# Patient Record
Sex: Male | Born: 1940 | Race: White | Hispanic: No | Marital: Married
Health system: Southern US, Community
[De-identification: ages and names within clinical notes are randomized; demographics above are authoritative.]

## PROBLEM LIST (undated history)

## (undated) DIAGNOSIS — E119 Type 2 diabetes mellitus without complications: Secondary | ICD-10-CM

## (undated) DIAGNOSIS — K219 Gastro-esophageal reflux disease without esophagitis: Secondary | ICD-10-CM

## (undated) DIAGNOSIS — F10959 Alcohol use, unspecified with alcohol-induced psychotic disorder, unspecified: Secondary | ICD-10-CM

## (undated) DIAGNOSIS — F419 Anxiety disorder, unspecified: Secondary | ICD-10-CM

## (undated) DIAGNOSIS — G4733 Obstructive sleep apnea (adult) (pediatric): Secondary | ICD-10-CM

## (undated) DIAGNOSIS — K222 Esophageal obstruction: Secondary | ICD-10-CM

## (undated) DIAGNOSIS — I219 Acute myocardial infarction, unspecified: Secondary | ICD-10-CM

## (undated) DIAGNOSIS — I2510 Atherosclerotic heart disease of native coronary artery without angina pectoris: Secondary | ICD-10-CM

## (undated) DIAGNOSIS — G8929 Other chronic pain: Secondary | ICD-10-CM

## (undated) DIAGNOSIS — Z8679 Personal history of other diseases of the circulatory system: Secondary | ICD-10-CM

## (undated) DIAGNOSIS — F10221 Alcohol dependence with intoxication delirium: Secondary | ICD-10-CM

## (undated) DIAGNOSIS — I10 Essential (primary) hypertension: Secondary | ICD-10-CM

## (undated) HISTORY — DX: Alcohol use, unspecified with alcohol-induced psychotic disorder, unspecified: F10.959

## (undated) HISTORY — DX: Esophageal obstruction: K22.2

## (undated) HISTORY — DX: Other chronic pain: G89.29

## (undated) HISTORY — DX: Anxiety disorder, unspecified: F41.9

## (undated) HISTORY — DX: Gastro-esophageal reflux disease without esophagitis: K21.9

## (undated) HISTORY — DX: Personal history of other diseases of the circulatory system: Z86.79

## (undated) HISTORY — DX: Alcohol dependence with intoxication delirium: F10.221

## (undated) HISTORY — DX: Obstructive sleep apnea (adult) (pediatric): G47.33

## (undated) HISTORY — DX: Acute myocardial infarction, unspecified: I21.9

## (undated) HISTORY — DX: Type 2 diabetes mellitus without complications: E11.9

## (undated) HISTORY — DX: Atherosclerotic heart disease of native coronary artery without angina pectoris: I25.10

---

## 2005-06-01 ENCOUNTER — Inpatient Hospital Stay (HOSPITAL_COMMUNITY): Admission: AD | Admit: 2005-06-01 | Discharge: 2005-06-03 | Payer: Self-pay | Admitting: Cardiology

## 2005-06-01 ENCOUNTER — Ambulatory Visit: Payer: Self-pay | Admitting: Cardiology

## 2005-06-16 ENCOUNTER — Ambulatory Visit: Payer: Self-pay | Admitting: Cardiology

## 2005-06-19 ENCOUNTER — Ambulatory Visit: Payer: Self-pay | Admitting: Internal Medicine

## 2005-07-27 ENCOUNTER — Ambulatory Visit: Payer: Self-pay | Admitting: Cardiology

## 2005-07-31 ENCOUNTER — Emergency Department (HOSPITAL_COMMUNITY): Admission: EM | Admit: 2005-07-31 | Discharge: 2005-07-31 | Payer: Self-pay | Admitting: *Deleted

## 2005-08-24 ENCOUNTER — Ambulatory Visit: Payer: Self-pay | Admitting: Internal Medicine

## 2009-01-21 ENCOUNTER — Emergency Department (HOSPITAL_COMMUNITY): Admission: EM | Admit: 2009-01-21 | Discharge: 2009-01-21 | Payer: Self-pay | Admitting: Emergency Medicine

## 2009-05-28 ENCOUNTER — Emergency Department (HOSPITAL_COMMUNITY): Admission: EM | Admit: 2009-05-28 | Discharge: 2009-05-28 | Payer: Self-pay | Admitting: Emergency Medicine

## 2010-09-10 ENCOUNTER — Encounter: Payer: Self-pay | Admitting: Emergency Medicine

## 2010-11-24 LAB — BASIC METABOLIC PANEL
BUN: 14 mg/dL (ref 6–23)
CO2: 27 mEq/L (ref 19–32)
Calcium: 9.8 mg/dL (ref 8.4–10.5)
Chloride: 102 mEq/L (ref 96–112)
Creatinine, Ser: 0.83 mg/dL (ref 0.4–1.5)
GFR calc Af Amer: >60 mL/min (ref >60)
GFR calc non Af Amer: >60 mL/min (ref >60)
Glucose, Bld: 108 mg/dL — ABNORMAL HIGH (ref 70–99)
Potassium: 3.9 mEq/L (ref 3.5–5.1)
Sodium: 140 mEq/L (ref 135–145)

## 2010-11-24 LAB — CBC
HCT: 40.2 % (ref 39.0–52.0)
Hemoglobin: 13.8 g/dL (ref 13.0–17.0)
MCHC: 34.3 g/dL (ref 30.0–36.0)
MCV: 97 fL (ref 78.0–100.0)
Platelets: 118 10*3/uL — ABNORMAL LOW (ref 150–400)
RBC: 4.15 MIL/uL — ABNORMAL LOW (ref 4.22–5.81)
RDW: 13.9 % (ref 11.5–15.5)
WBC: 6.4 10*3/uL (ref 4.0–10.5)

## 2010-11-24 LAB — POCT CARDIAC MARKERS
CKMB, poc: 2.1 ng/mL (ref 1.0–8.0)
Myoglobin, poc: 90.4 ng/mL (ref 12–200)
Troponin i, poc: <0.05 ng/mL (ref 0.00–0.09)

## 2011-01-06 NOTE — H&P (Signed)
NAMEBARTH, TRELLA NO.:  [id number]   MEDICAL RECORD NO.:  [id number]           PATIENT TYPE:   LOCATION:                                 FACILITY:   PHYSICIAN:  Salvadore Farber, M.D. [facility] DATE OF BIRTH:   DATE OF ADMISSION:  06/01/2005  DATE OF DISCHARGE:                                HISTORY & PHYSICAL   CHIEF COMPLAINT:  Acute inferior myocardial infarction.   HISTORY OF PRESENT ILLNESS:  Mr. Blancett is a 70 year old gentleman with no  prior history of cardiovascular disease.  His atherosclerotic risk factors  include age, hypertension, sex, and unknown lipid status.   Mr. Garron developed substernal chest discomfort between 10-11 p.m. last  night.  He took a muscle relaxant for this with some improvement, but not  complete resolution of his discomfort.  He slept for a bit and then awoke  with persistent pain.  He eventually presented to the [facility] Emergency  Room at 4:45 a.m.  He denies any shortness of breath, nausea, syncope, or  palpitations.  The pain radiated to his neck and left arm.  Electrocardiogram at [facility] demonstrated inferior myocardial  infarction.  He was then transferred emergently for cardiac catheterization  and possible percutaneous coronary revascularization.  He arrived here with  ongoing 4/10 substernal chest discomfort radiating to the left arm.  He was  hemodynamically stable upon arrival.   PAST MEDICAL HISTORY:  1.  Hypertension.  2.  Medical noncompliance.  3.  Distant left shoulder surgery.   ALLERGIES:  BENADRYL.   MEDICATIONS:  An antihypertensive that he cannot recall.   SOCIAL HISTORY:  Patient is married, but does not live with his wife.  He  lives in an RV and travels.  Wife states that this living situation is due  to his ongoing alcohol abuse.  He lists his mailing address in a post office  box in [place].  He has a son who lives in [place].  He admits to alcohol  abuse, but will not quantify.  Wife  thinks it is very heavy.  Patient says  he has not drank in two weeks, wife disagrees.  Denies tobacco abuse.   FAMILY HISTORY:  Mother died in her 19s of heart disease.  Father died in  his 5s of cause unknown to the patient.  His only sister is alive and well.   REVIEW OF SYSTEMS:  Negative in detail except as above.   PHYSICAL EXAMINATION:  GENERAL:  He is a generally well-appearing,  overweight gentleman in no distress with heart rate 64, blood pressure  149/72.  HEENT:  Remarkable for dentures on the upper teeth, otherwise normal.  SKIN:  Normal.  MUSCULOSKELETAL:  Normal.  NECK:  He has no jugular venous distention and no thyromegaly.  LUNGS:  Respiratory effort is normal.  Lungs are clear to auscultation.  HEART:  He has a nondisplaced point of maximal cardiac impulse.  There is a  regular rate and rhythm without murmurs, rubs, or gallops.  ABDOMEN:  Soft, nondistended, nontender.  There is no hepatosplenomegaly.  Bowel sounds are normal.  There is no mass.  EXTREMITIES:  Warm without clubbing, cyanosis, edema, or ulceration.  Carotid pulses are 2+ bilaterally without bruits.  Femoral pulses 2+  bilaterally without bruits.  PT pulses 2+ bilaterally.  DP pulse 2+ on the  right and trace on the left.  He is alert and oriented x3 with normal  affect.   Electrocardiogram:  Normal sinus rhythm with approximately 3 mm of inferior  ST elevations and anterolateral depressions.   IMPRESSION/RECOMMENDATION:  1.  Chest pain:  Acute inferior myocardial infarction.  Proceeding to urgent      catheterization and probable percutaneous coronary intervention.  2.  Hypertension:  Will add beta blocker and likely ACE inhibitor.  3.  Questionable lipid status.  Begin high-dose Statin and check fasting      lipid profile.      Salvadore Farber, M.D. [facility]  Electronically Signed     WED/MEDQ  D:  06/01/2005  T:  06/01/2005  Job:  [id number]

## 2011-01-06 NOTE — Cardiovascular Report (Signed)
NAMEGIOVANY, COSBY NO.:  [id number]   MEDICAL RECORD NO.:  [id number]          PATIENT TYPE:  EMS   LOCATION:  ED                           FACILITY:  [facility]   PHYSICIAN:  Salvadore Farber, M.D. LHCDATE OF BIRTH:  09/08/1940   DATE OF PROCEDURE:  06/01/2005  DATE OF DISCHARGE:                              CARDIAC CATHETERIZATION   PROCEDURE:  Left heart catheterization, left ventriculography, coronary  angiography, bare metal stenting of the distal right coronary artery.   INDICATIONS:  Mr. Vences is a 70 year old gentleman with hypertension and  alcohol abuse.  He has no prior history of cardiac disease.  He presents  with acute inferior myocardial infarction.  Pain onset was at 11 p.m.  He  arrived to the [facility] emergency room at approximately 4:45 a.m.  An  electrocardiogram demonstrated inferior ST elevations.  He had ongoing chest  discomfort.  He was treated with aspirin and heparin and transferred  emergently to the catheterization lab at [facility].  He arrived  here at 5:31 with ongoing 3/10 chest discomfort.  We then proceeded to  diagnostic angiography with an eye to percutaneous revascularization.   PROCEDURAL TECHNIQUE:  Informed consent was obtained.  Under 1% local  anesthesia, a 6 French sheath was placed in the right common femoral artery  using modified Seldinger technique.  Diagnostic angiography was performed  using a diagnostic JL4 catheter for the left system and a JR4 guiding  catheter for the right.  These images demonstrated multivessel disease with  the culprit lesion being a heavily thrombotic 99% stenosis of the distal RCA  just before the takeoff of the PDA.  There was TIMI-2 flow to the distal  vasculature.  We then proceeded to urgent revascularization.   Additional heparin was given to achieve and maintain an ACT of greater than  200 seconds.  Double bolus eptifibatide was administered.  The patient had  consented  to participate in the CHAMPION trial.  He was randomized and then  treated with study drug per protocol.   A ProWater wire was advanced beyond the lesion into the PDA without  difficulty.  I then attempted to pass a Diver thrombectomy catheter.  However, I was unable to pass this beyond the midlesion due to very poor  guide support and tortuosity of the vessel.  I therefore proceeded to  predilation with a 3.0 x 12 mm Maverick at 6 atmospheres.  This improved  flow from TIMI-2 to TIMI-3.  It was difficult to pass this balloon.  It was  clear that I would be unable to pass a stent with the current guide support.  Therefore, I removed guide and wire and replaced the guide with an ART-4  guide.  This was engaged in the ostium of the RCA.  I then advanced a  support wire across the lesion into the PDA.  I then advanced a ProWater  wire also into the PDA to be used in a buddy fashion.  I then advanced a 3.5  x 15 mm Driver stent over the support wire.  I removed the ProWater wire  once the stent was in position.  I then deployed the stent at 16  atmospheres, taking care to position the distal margin of the stent just  proximal to the bifurcation of the RCA.  Repeat angiography demonstrated no  residual stenosis, no embolism, and TIMI-3 flow to the distal vasculature.   The guiding catheter was then removed.  A pigtail catheter was then advanced  over a wire and positioned in the left ventricle.  Left heart  catheterization and ventriculography were performed.  The catheter was then  removed over a wire and the patient was transferred to the holding room in  stable condition.  He tolerated the procedure well.   COMPLICATIONS:  None.   Pain onset 11 p.m., [facility] emergency room arrival [phone],  catheterization lab arrival [phone], reperfusion 0559.   FINDINGS:  1.  LV:  147/9/25.  EF 60% with inferior akinesis.  2.  No aortic stenosis or mitral regurgitation.  3.  Left main:   Angiographically normal.  4.  LAD:  Moderate-sized vessel giving rise to a single large diagonal.  The      proximal LAD has a 70% stenosis crossing the origin of this diagonal.      The diagonal has an 80% ostial stenosis.  5.  Ramus intermedius:  Moderate-sized vessel which is angiographically      normal.  6.  Circumflex:  Moderate-sized vessel giving rise to a single large      marginal.  There is a 90% stenosis proximally and a 60% stenosis in the      midvessel.  7.  RCA:  Large, dominant vessel.  It is tortuous proximally.  There is a      40% stenosis through the proximal vessel.  There was then a heavily      thrombotic 99% stenosis in the vessel just before the takeoff of the      PDA.  A large PLV has a 70% stenosis proximally.  The thrombotic lesion      was stented to no residual.   IMPRESSION/RECOMMENDATIONS:  Successful percutaneous revascularization of  the culprit lesion in the right coronary artery.  He does have residual  severe disease in the left system, including a bifurcation left anterior  descending artery lesion.  He has a history of very poor compliance with  medicine, medical therapy.  Recommend coronary artery bypass grafting.      Salvadore Farber, M.D. [facility]  Electronically Signed     WED/MEDQ  D:  06/01/2005  T:  06/01/2005  Job:  [id number]

## 2011-01-06 NOTE — Discharge Summary (Signed)
NAMEWINDLE, HUEBERT                 ACCOUNT NO.:  [id number]   MEDICAL RECORD NO.:  [id number]          PATIENT TYPE:  INP   LOCATION:  2906                         FACILITY:  [facility]   PHYSICIAN:  Salvadore Farber, M.D. LHCDATE OF BIRTH:  1941/07/28   DATE OF ADMISSION:  06/01/2005  DATE OF DISCHARGE:  06/03/2005                                 DISCHARGE SUMMARY   PRINCIPAL DIAGNOSIS:  Acute inferior myocardial infarction/coronary artery  disease.   OTHER DIAGNOSES:  1.  Hypertension.  2.  Medical non-adherence.  3.  Distant left shoulder surgery.   ALLERGIES:  Benadryl.   PROCEDURES:  Left heart cardiac catheterization with PCI and stenting of the  right coronary artery with a 3.5 by 15 mm Driver bare metal stent.   HISTORY OF PRESENT ILLNESS:  70 year old male with no prior history of CAD  who developed substernal chest pain at approximately 10 to 11 p.m. on  June 01, 2005, prompting him to take a muscle relaxant with some  improvement but not complete resolution of the discomfort.  He slept a bit  and then awoke with persistent pain at approximately 4:45 a.m. without  associated symptoms with radiation to the neck and left arm.  He presented  to [facility] where an EKG was suggestive of inferior myocardial  infarction.  He was then transferred emergently for cardiac catheterization  to [facility].   HOSPITAL COURSE:  The patient underwent left heart cardiac catheterization  performed by Dr. Samule Ohm revealing normal LV function with an EF of 60% and  inferior akinesis.  He was noted to have a 70% stenosis in the proximal LAD  with a normal ramus, a 90% lesion in the mid left circumflex, and a 99%  thrombotic stenosis in the distal RCA.  The RCA lesion was felt to be the  culprit lesion and this was successfully angioplastied and stented with a 3  by 50 mm Driver stent.  Following this procedure, he was monitored in the  CCU and was initiated on aspirin,  beta blocker, statin therapy.  Plavix was  not immediately used secondary to his need for CVTS consult given his  residual LAD and circumflex disease.  Mr. Dutter refused consideration for  bypass surgery and, therefore, Plavix was initiated on June 02, 2005.  He  continued to have intermittent and vague chest discomfort while  hospitalized, however, on October 14, was adamant about leaving against  medical advice.  Prior to his discharge, we did counsel him on the  importance of alcohol cessation and also provided him with the number for  Dr. Tyrone Sage in cardiovascular and thoracic surgery should he have a change  of heart.   DISCHARGE LABORATORY DATA:  Hemoglobin 12.5, hematocrit 36.6, WBC 7.3,  platelets 242, sodium, 139, potassium 3.9, chloride 109, CO2 25, BUN 11,  creatinine 0.9, glucose 102, total bilirubin 0.5, alkaline phos 52, AST 22,  ALT 18, total protein 5.7, albumin 3.1, calcium 8.9.  Hemoglobin A1C 5.9.  Peak CK 205, peak MB 25.4, peak troponin I 2.61.  Total cholesterol 171,  triglycerides 94, HDL 44, LDL 108, TSH 1.224.   DISPOSITION:  The patient is leaving against medical advice today.   DISCHARGE INSTRUCTIONS:  The patient was advised to contact Dr. Melinda Crutch  office for a follow up appointment in approximately two weeks.  He is also  provided for the number for Dr. Sheliah Plane for cardiac surgical follow  up should the patient reconsider.   DISCHARGE MEDICATIONS:  Atorvastatin 80 mg q.h.s., aspirin 325 mg daily,  Metoprolol 25 mg t.i.d., multi-vitamin one daily, Soma 350 mg 3 tabs daily,  potassium and magnesium 250 mg daily, Plavix 75 mg daily.   Outstanding lab studies none.  Duration of discharge encounter 20 minutes.      Ok Anis, NP      Salvadore Farber, M.D. [facility]  Electronically Signed    CRB/MEDQ  D:  08/28/2005  T:  08/28/2005  Job:  [id number]

## 2012-11-19 ENCOUNTER — Emergency Department (HOSPITAL_COMMUNITY): Payer: Medicare Other

## 2012-11-19 ENCOUNTER — Encounter (HOSPITAL_COMMUNITY): Payer: Self-pay | Admitting: Emergency Medicine

## 2012-11-19 ENCOUNTER — Emergency Department (HOSPITAL_COMMUNITY)
Admission: EM | Admit: 2012-11-19 | Discharge: 2012-11-19 | Disposition: A | Discharge status: Home / Self Care | Payer: Medicare Other | Attending: Emergency Medicine | Admitting: Emergency Medicine

## 2012-11-19 DIAGNOSIS — IMO0002 Reserved for concepts with insufficient information to code with codable children: Secondary | ICD-10-CM | POA: Insufficient documentation

## 2012-11-19 DIAGNOSIS — F10929 Alcohol use, unspecified with intoxication, unspecified: Secondary | ICD-10-CM

## 2012-11-19 DIAGNOSIS — I10 Essential (primary) hypertension: Secondary | ICD-10-CM | POA: Insufficient documentation

## 2012-11-19 DIAGNOSIS — R4789 Other speech disturbances: Secondary | ICD-10-CM | POA: Insufficient documentation

## 2012-11-19 DIAGNOSIS — F411 Generalized anxiety disorder: Secondary | ICD-10-CM | POA: Insufficient documentation

## 2012-11-19 DIAGNOSIS — F1010 Alcohol abuse, uncomplicated: Secondary | ICD-10-CM | POA: Insufficient documentation

## 2012-11-19 DIAGNOSIS — I959 Hypotension, unspecified: Secondary | ICD-10-CM

## 2012-11-19 HISTORY — DX: Essential (primary) hypertension: I10

## 2012-11-19 LAB — CBC WITH DIFFERENTIAL/PLATELET
Basophils Absolute: 0 10*3/uL (ref 0.0–0.1)
Lymphocytes Relative: 49 % — ABNORMAL HIGH (ref 12–46)
Monocytes Relative: 12 % (ref 3–12)
Neutrophils Relative %: 37 % — ABNORMAL LOW (ref 43–77)
Platelets: 215 10*3/uL (ref 150–400)
RDW: 13.1 % (ref 11.5–15.5)
WBC: 9.3 10*3/uL (ref 4.0–10.5)

## 2012-11-19 LAB — COMPREHENSIVE METABOLIC PANEL
ALT: 39 U/L (ref 0–53)
AST: 47 U/L — ABNORMAL HIGH (ref 0–37)
Calcium: 8.8 mg/dL (ref 8.4–10.5)
Creatinine, Ser: 1.49 mg/dL — ABNORMAL HIGH (ref 0.50–1.35)
GFR calc Af Amer: 53 mL/min — ABNORMAL LOW (ref >90)
Sodium: 140 mEq/L (ref 135–145)
Total Protein: 6.7 g/dL (ref 6.0–8.3)

## 2012-11-19 LAB — PROTIME-INR
INR: 1.01 (ref 0.00–1.49)
Prothrombin Time: 13.2 seconds (ref 11.6–15.2)

## 2012-11-19 LAB — OCCULT BLOOD, POC DEVICE: Fecal Occult Bld: NEGATIVE

## 2012-11-19 LAB — URINALYSIS, ROUTINE W REFLEX MICROSCOPIC
Bilirubin Urine: NEGATIVE
Nitrite: NEGATIVE
Protein, ur: NEGATIVE mg/dL
Specific Gravity, Urine: 1.034 — ABNORMAL HIGH (ref 1.005–1.030)
Urobilinogen, UA: 0.2 mg/dL (ref 0.0–1.0)

## 2012-11-19 LAB — TROPONIN I: Troponin I: <0.3 ng/mL (ref <0.30)

## 2012-11-19 MED ORDER — THIAMINE HCL 100 MG/ML IJ SOLN
Freq: Once | INTRAVENOUS | Status: AC
  Administered 2012-11-19: 16:00:00 via INTRAVENOUS
  Filled 2012-11-19: qty 1000

## 2012-11-19 MED ORDER — SODIUM CHLORIDE 0.9 % IV BOLUS (SEPSIS)
500.0000 mL | Freq: Once | INTRAVENOUS | Status: AC
  Administered 2012-11-19: 500 mL via INTRAVENOUS

## 2012-11-19 NOTE — ED Notes (Signed)
Patient transported to CT 

## 2012-11-19 NOTE — ED Provider Notes (Signed)
Pt BP improved with IVF. Pt is now awake. States he drinks daily and was drinking heavily at the bar. He denies chest pain or shortness of breath. Requesting food. Will repeat CXR with better positioning. I believe disection is very unlikely.    Repeat CXR with no evidence of dissection. Pt tolerating PO's. Ambulated without difficulty. No slurred speech. Not clinically intoxicated. Will d/c home.   Loren Racer, MD 11/19/12 2201

## 2012-11-19 NOTE — Progress Notes (Signed)
[facility] ED CM noted pt with coverage but no pcp listed Spoke with pt who would not open his eyes during CM assessment.  Spoke with Cm with eyes closed and confirms no pcp but aware of how to obtain one with [employer] insurance coverage He would not answer if he is followed by [facility], [facility] or the [facility]

## 2012-11-19 NOTE — ED Provider Notes (Signed)
History     CSN: [id number]  Arrival date & time 11/19/12  1410   First MD Initiated Contact with Patient 11/19/12 1441      Chief Complaint  Patient presents with  . Alcohol Intoxication    (Consider location/radiation/quality/duration/timing/severity/associated sxs/prior treatment) HPI Comments: Patient brought to the ER from the bar. Patient was found sitting at a table, slumped over and was difficult to awaken. Patient brought to the emergency department. He has been hypotensive during transport. On arrival, patient is cursing and agitated, does not answer questions. He does follow commands. No further information available. Level 5 caveat applies due to patient's mental status.  Patient is a 72 y.o. male presenting with intoxication.  Alcohol Intoxication    No past medical history on file.  No past surgical history on file.  No family history on file.  History  Substance Use Topics  . Smoking status: Not on file  . Smokeless tobacco: Not on file  . Alcohol Use: Not on file      Review of Systems  Unable to perform ROS   Allergies  Review of patient's allergies indicates not on file.  Home Medications  No current outpatient prescriptions on file.  BP 91/53  Pulse 59  Temp(Src) 97.4 F (36.3 C) (Axillary)  Resp 18  SpO2 93%  Physical Exam  Constitutional: He appears well-developed and well-nourished. He appears distressed.  HENT:  Head: Normocephalic and atraumatic.  Right Ear: Hearing normal.  Nose: Nose normal.  Mouth/Throat: Oropharynx is clear and moist and mucous membranes are normal.  Eyes: Conjunctivae and EOM are normal. Pupils are equal, round, and reactive to light.  Neck: Normal range of motion. Neck supple.  Cardiovascular: Normal rate, regular rhythm, S1 normal and S2 normal.  Exam reveals no gallop and no friction rub.   No murmur heard. Pulmonary/Chest: Effort normal and breath sounds normal. No respiratory distress. He exhibits no  tenderness.  Abdominal: Soft. Normal appearance and bowel sounds are normal. There is no hepatosplenomegaly. There is no tenderness. There is no rebound, no guarding, no tenderness at McBurney's point and negative Murphy's sign. No hernia.  Musculoskeletal: Normal range of motion.  Neurological: He is alert. He has normal strength. No cranial nerve deficit or sensory deficit. Coordination normal. GCS eye subscore is 4. GCS verbal subscore is 5. GCS motor subscore is 6.  Skin: Skin is warm, dry and intact. No rash noted. No cyanosis.  Psychiatric: His mood appears anxious. His speech is slurred. He is agitated.    ED Course  Procedures (including critical care time)  EKG  Date: 11/19/2012  Rate: 62  Rhythm: normal sinus rhythm  QRS Axis: normal  Intervals: normal  ST/T Wave abnormalities: nonspecific ST/T changes  Conduction Disutrbances:right bundle branch block  Narrative Interpretation:   Old EKG Reviewed: unchanged    Labs Reviewed  PROTIME-INR  CBC WITH DIFFERENTIAL  COMPREHENSIVE METABOLIC PANEL  PRO B NATRIURETIC PEPTIDE  TROPONIN I  URINALYSIS, ROUTINE W REFLEX MICROSCOPIC  ETHANOL  OCCULT BLOOD, POC DEVICE   Dg Chest Port 1 View  11/19/2012  *RADIOLOGY REPORT*  Clinical Data: Hypotension.  Mental status changes.  PORTABLE CHEST - 1 VIEW  Comparison: 05/28/2009.  Findings: Cardiomegaly.  Pulmonary vascular congestion.  No gross pneumothorax or segmental consolidation.  Prominent aortic knob.  This is more notable than on the prior examination which may reflect changes of progressive atherosclerosis and / or magnification however, if there were any clinical suspicion of aortic abnormality contributing to  the patient's hypertension, CT may then be considered.  IMPRESSION: Prominent aortic knob.  This is more notable than on the prior examination which may reflect changes of progressive atherosclerosis and / or magnification however, if there were any clinical suspicion of  aortic abnormality contributing to the patient's hypertension, CT may then be considered.  Cardiomegaly.  Pulmonary vascular congestion.  Critical Value/emergent results were called by telephone at the time of interpretation on 11/20/2038 at 3:30 p.m. to Dr. Blinda Leatherwood, who verbally acknowledged these results.   Original Report Authenticated By: Lacy Duverney, M.D.      Diagnosis: Acute alcohol intoxication; hypertension    MDM  Patient presents from a bar. He is acutely intoxicated. Patient was found to be very hypertensive as well. At this time, source of hypotension is unclear. He does not appear to be having any ongoing fluid losses. No evidence of bleeding. Rectal was negative. Patient's blood pressure is slowly coming up with IV fluids. Patient's lactate was moderately elevated at arrival. Cannot rule out sepsis at this time. Did not have an obvious source, however.  Patient's chest x-ray did not show any evidence of pneumonia. He was, however, concern for widening of the mediastinum. Awaiting BUN/creatinine to see if the patient can begin IV dye. As his pressure is improving I believe there is time to wait for CT angiography.  Remainder of the labs and workup not back at this time. Will sign out to Dr. Ranae Palms to follow and disposition.      Gilda Crease, MD 11/19/12 [phone]

## 2012-11-19 NOTE — ED Notes (Signed)
Bed:WA05<BR> Expected date:<BR> Expected time:<BR> Means of arrival:<BR> Comments:<BR>

## 2012-11-19 NOTE — ED Notes (Signed)
Per [employer]: Pt picked up from [employer].  Pt was sitting at a table and dozing off.  Pt was not responding to questions appropriately.

## 2012-11-19 NOTE — ED Notes (Signed)
Patient transported to X-ray 

## 2013-10-19 HISTORY — PX: CORONARY ARTERY BYPASS GRAFT: SHX141

## 2013-11-07 ENCOUNTER — Other Ambulatory Visit: Payer: Self-pay | Admitting: *Deleted

## 2013-11-07 MED ORDER — CHLORDIAZEPOXIDE HCL 10 MG PO CAPS: ORAL_CAPSULE | ORAL | Status: DC

## 2013-11-07 MED ORDER — CHLORDIAZEPOXIDE HCL 10 MG PO CAPS: ORAL_CAPSULE | ORAL | Status: AC

## 2013-11-07 MED ORDER — LORAZEPAM 2 MG PO TABS: ORAL_TABLET | ORAL | Status: AC

## 2013-11-07 MED ORDER — LORAZEPAM 2 MG PO TABS: ORAL_TABLET | ORAL | Status: DC

## 2013-11-07 NOTE — Telephone Encounter (Signed)
[facility] [place] 

## 2013-11-07 NOTE — Telephone Encounter (Signed)
[facility] [place] 

## 2013-11-11 ENCOUNTER — Non-Acute Institutional Stay (SKILLED_NURSING_FACILITY): Payer: Medicare Other | Admitting: Internal Medicine

## 2013-11-11 DIAGNOSIS — G4733 Obstructive sleep apnea (adult) (pediatric): Secondary | ICD-10-CM

## 2013-11-11 DIAGNOSIS — F10931 Alcohol use, unspecified with withdrawal delirium: Secondary | ICD-10-CM

## 2013-11-11 DIAGNOSIS — K222 Esophageal obstruction: Secondary | ICD-10-CM

## 2013-11-11 DIAGNOSIS — F10231 Alcohol dependence with withdrawal delirium: Secondary | ICD-10-CM

## 2013-11-11 DIAGNOSIS — F411 Generalized anxiety disorder: Secondary | ICD-10-CM

## 2013-11-11 DIAGNOSIS — Z951 Presence of aortocoronary bypass graft: Secondary | ICD-10-CM

## 2013-11-11 DIAGNOSIS — G8929 Other chronic pain: Secondary | ICD-10-CM

## 2013-11-11 DIAGNOSIS — F10959 Alcohol use, unspecified with alcohol-induced psychotic disorder, unspecified: Secondary | ICD-10-CM

## 2013-11-11 DIAGNOSIS — F419 Anxiety disorder, unspecified: Secondary | ICD-10-CM

## 2013-11-11 DIAGNOSIS — F10221 Alcohol dependence with intoxication delirium: Secondary | ICD-10-CM

## 2013-11-11 DIAGNOSIS — F10988 Alcohol use, unspecified with other alcohol-induced disorder: Secondary | ICD-10-CM

## 2013-11-11 DIAGNOSIS — E119 Type 2 diabetes mellitus without complications: Secondary | ICD-10-CM

## 2013-11-11 DIAGNOSIS — I4891 Unspecified atrial fibrillation: Secondary | ICD-10-CM

## 2013-11-11 DIAGNOSIS — I2510 Atherosclerotic heart disease of native coronary artery without angina pectoris: Secondary | ICD-10-CM

## 2013-11-16 ENCOUNTER — Encounter: Payer: Self-pay | Admitting: Internal Medicine

## 2013-11-16 DIAGNOSIS — K219 Gastro-esophageal reflux disease without esophagitis: Secondary | ICD-10-CM | POA: Insufficient documentation

## 2013-11-16 DIAGNOSIS — Z8679 Personal history of other diseases of the circulatory system: Secondary | ICD-10-CM | POA: Insufficient documentation

## 2013-11-16 DIAGNOSIS — K222 Esophageal obstruction: Secondary | ICD-10-CM | POA: Insufficient documentation

## 2013-11-16 DIAGNOSIS — G8929 Other chronic pain: Secondary | ICD-10-CM | POA: Insufficient documentation

## 2013-11-16 DIAGNOSIS — F10959 Alcohol use, unspecified with alcohol-induced psychotic disorder, unspecified: Secondary | ICD-10-CM | POA: Insufficient documentation

## 2013-11-16 DIAGNOSIS — I4891 Unspecified atrial fibrillation: Secondary | ICD-10-CM | POA: Insufficient documentation

## 2013-11-16 DIAGNOSIS — Z951 Presence of aortocoronary bypass graft: Secondary | ICD-10-CM | POA: Insufficient documentation

## 2013-11-16 DIAGNOSIS — E119 Type 2 diabetes mellitus without complications: Secondary | ICD-10-CM | POA: Insufficient documentation

## 2013-11-16 DIAGNOSIS — G4733 Obstructive sleep apnea (adult) (pediatric): Secondary | ICD-10-CM | POA: Insufficient documentation

## 2013-11-16 DIAGNOSIS — I2510 Atherosclerotic heart disease of native coronary artery without angina pectoris: Secondary | ICD-10-CM | POA: Insufficient documentation

## 2013-11-16 DIAGNOSIS — I219 Acute myocardial infarction, unspecified: Secondary | ICD-10-CM | POA: Insufficient documentation

## 2013-11-16 DIAGNOSIS — F419 Anxiety disorder, unspecified: Secondary | ICD-10-CM | POA: Insufficient documentation

## 2013-11-16 DIAGNOSIS — F10221 Alcohol dependence with intoxication delirium: Secondary | ICD-10-CM | POA: Insufficient documentation

## 2013-11-16 NOTE — Assessment & Plan Note (Signed)
Pt on lipitor and ASA

## 2013-11-16 NOTE — Progress Notes (Signed)
MRN: [id number] Name: Randy Hines  Sex: male Age: 73 y.o. DOB: [birth year]  PSC #: Ronni Rumble Facility/Room: 132B Level Of Care: SNF Provider: Merrilee Seashore D Emergency Contacts: Extended Emergency Contact Information Primary Emergency Contact: Lorson,PATRICIA Address: [address] [place],  [address] Home Phone: [phone] Relation: None  Code Status: FULL  Allergies: Review of patient's allergies indicates no known allergies.  Chief Complaint  Patient presents with  . nursing home admission    HPI: Patient is 73 y.o. male who is s/p CABG at the [place] complicated by delerium and psychosis from withdrawal from chronic alcohol abuse who is admitted to SNF for OT/PT.  Past Medical History  Diagnosis Date  . Hypertension   . CAD (coronary artery disease)   . Myocardial infarction   . Anxiety   . Alcohol dependence with delirium   . 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 Diabetes mellitus without complication   . OSA (obstructive sleep apnea)   . Esophageal stricture   . Chronic pain   . GERD (gastroesophageal reflux disease)   . History of atrial fibrillation     Past Surgical History  Procedure Laterality Date  . Coronary artery bypass graft  10/2013      Medication List       This list is accurate as of: 11/11/13 11:59 PM.  Always use your most recent med list.               acetaminophen 325 MG tablet  Commonly known as:  TYLENOL  Take 650 mg by mouth every 4 (four) hours as needed. Not to exceed 3000mg /24     amiodarone 200 MG tablet  Commonly known as:  PACERONE  Take 200 mg by mouth daily.     ARIPiprazole 15 MG tablet  Commonly known as:  ABILIFY  Take 15 mg by mouth daily.     aspirin 325 MG tablet  Take 325 mg by mouth daily.     atorvastatin 20 MG tablet  Commonly known as:  LIPITOR  Take 20 mg by mouth daily.     bisacodyl 10 MG suppository  Commonly known as:  DULCOLAX  Place 10 mg rectally every other day as needed for moderate  constipation.     chlordiazePOXIDE 10 MG capsule  Commonly known as:  LIBRIUM  Take one capsule by mouth once daily     Cholecalciferol 5000 UNITS Tabs  Take by mouth daily.     docusate 50 MG/5ML liquid  Commonly known as:  COLACE  Take 100 mg by mouth 2 (two) times daily.     ferrous sulfate 220 (44 FE) MG/5ML solution  Take 220 mg by mouth 3 (three) times daily.     gabapentin 600 MG tablet  Commonly known as:  NEURONTIN  Take 600 mg by mouth 4 (four) times daily.     guaifenesin 100 MG/5ML syrup  Commonly known as:  ROBITUSSIN  Take 300 mg by mouth 3 (three) times daily.     haloperidol 5 MG tablet  Commonly known as:  HALDOL  Take 5 mg by mouth every 6 (six) hours as needed for agitation.     heparin 5000 UNIT/ML injection  Inject 5,000 Units into the skin every 8 (eight) hours.     insulin aspart 100 UNIT/ML injection  Commonly known as:  novoLOG  Inject into the skin 3 (three) times daily before meals. SS- see MAR  LORazepam 2 MG tablet  Commonly known as:  ATIVAN  Take one tablet by mouth every hour as needed for agitation     magnesium oxide 400 MG tablet  Commonly known as:  MAG-OX  Take 400 mg by mouth 2 (two) times daily.     oxyCODONE-acetaminophen 5-325 MG per tablet  Commonly known as:  PERCOCET/ROXICET  Take 1-2 tablets by mouth every 4 (four) hours as needed for severe pain.     potassium chloride 20 MEQ/15ML (10%) solution  Take 20 mEq by mouth 2 (two) times daily.     sennosides 8.8 MG/5ML syrup  Commonly known as:  SENOKOT  Take 5 mLs by mouth at bedtime.     thiamine 100 MG tablet  Take 200 mg by mouth 2 (two) times daily.     vitamin C 500 MG tablet  Commonly known as:  ASCORBIC ACID  Take 500 mg by mouth 2 (two) times daily.        Meds ordered this encounter  Medications  . amiodarone (PACERONE) 200 MG tablet    Sig: Take 200 mg by mouth daily.  . ARIPiprazole (ABILIFY) 15 MG tablet    Sig: Take 15 mg by mouth daily.  Marland Kitchen  aspirin 325 MG tablet    Sig: Take 325 mg by mouth daily.  Marland Kitchen atorvastatin (LIPITOR) 20 MG tablet    Sig: Take 20 mg by mouth daily.  . bisacodyl (DULCOLAX) 10 MG suppository    Sig: Place 10 mg rectally every other day as needed for moderate constipation.  . Cholecalciferol 5000 UNITS TABS    Sig: Take by mouth daily.  Marland Kitchen docusate (COLACE) 50 MG/5ML liquid    Sig: Take 100 mg by mouth 2 (two) times daily.  . ferrous sulfate 220 (44 FE) MG/5ML solution    Sig: Take 220 mg by mouth 3 (three) times daily.  Marland Kitchen gabapentin (NEURONTIN) 600 MG tablet    Sig: Take 600 mg by mouth 4 (four) times daily.  Marland Kitchen guaifenesin (ROBITUSSIN) 100 MG/5ML syrup    Sig: Take 300 mg by mouth 3 (three) times daily.  . haloperidol (HALDOL) 5 MG tablet    Sig: Take 5 mg by mouth every 6 (six) hours as needed for agitation.  . heparin 5000 UNIT/ML injection    Sig: Inject 5,000 Units into the skin every 8 (eight) hours.  . insulin aspart (NOVOLOG) 100 UNIT/ML injection    Sig: Inject into the skin 3 (three) times daily before meals. SS- see MAR  . magnesium oxide (MAG-OX) 400 MG tablet    Sig: Take 400 mg by mouth 2 (two) times daily.  Marland Kitchen oxyCODONE-acetaminophen (PERCOCET/ROXICET) 5-325 MG per tablet    Sig: Take 1-2 tablets by mouth every 4 (four) hours as needed for severe pain.  . potassium chloride 20 MEQ/15ML (10%) solution    Sig: Take 20 mEq by mouth 2 (two) times daily.  Marland Kitchen thiamine 100 MG tablet    Sig: Take 200 mg by mouth 2 (two) times daily.  . sennosides (SENOKOT) 8.8 MG/5ML syrup    Sig: Take 5 mLs by mouth at bedtime.  Marland Kitchen acetaminophen (TYLENOL) 325 MG tablet    Sig: Take 650 mg by mouth every 4 (four) hours as needed. Not to exceed 3000mg /24  . vitamin C (ASCORBIC ACID) 500 MG tablet    Sig: Take 500 mg by mouth 2 (two) times daily.     There is no immunization history on file for this patient.  History  Substance Use Topics  . Smoking status: Unknown If Ever Smoked  . Smokeless tobacco: Not  on file  . Alcohol Use: Yes     Comment: abuse    Family history is noncontributory    Review of Systems  DATA OBTAINED: from patient,  family member; only c/o is would like sleep study GENERAL: Feels well no fevers, fatigue, appetite changes SKIN: No itching, rash or wounds EYES: No eye pain, redness, discharge EARS: No earache, tinnitus, change in hearing NOSE: No congestion, drainage or bleeding  MOUTH/THROAT: No mouth or tooth pain, No sore throat, No difficulty chewing or swallowing  RESPIRATORY: No cough, wheezing, SOB CARDIAC: No chest pain, palpitations, lower extremity edema  GI: No abdominal pain, No N/V/D or constipation, No heartburn or reflux  GU: No dysuria, frequency or urgency, or incontinence  MUSCULOSKELETAL: No unrelieved bone/joint pain NEUROLOGIC: No headache, dizziness or focal weakness PSYCHIATRIC: No overt anxiety or sadness. Sleeps well. No behavior issue.   Filed Vitals:   11/16/13 1605  BP: 132/83  Pulse: 78  Temp: 98.7 F (37.1 C)  Resp: 20    Physical Exam  GENERAL APPEARANCE: Alert, conversant. Appropriately groomed. No acute distress.  SKIN - no rash or diaphoresis HEAD: Normocephalic, atraumatic  EYES: Conjunctiva/lids clear. Pupils round, reactive. EOMs intact.  EARS: External exam WNL, canals clear. Hearing grossly normal.  NOSE: No deformity or discharge.  MOUTH/THROAT: Lips w/o lesions. RESPIRATORY: Breathing is even, unlabored. Lung sounds are clear   CARDIOVASCULAR: Heart RRR no murmurs, rubs or gallops. 1+ peripheral edema.  GASTROINTESTINAL: Abdomen is soft, non-tender, not distended w/ normal bowel sounds GENITOURINARY: Bladder non tender, not distended  MUSCULOSKELETAL: No abnormal joints or musculature NEUROLOGIC: Oriented X3. Cranial nerves 2-12 grossly intact. Moves all extremities no tremor. PSYCHIATRIC: Mood and affect appropriate to situation, no behavioral issues  Patient Active Problem List   Diagnosis Date Noted  .  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 Atrial fibrillation 11/16/2013  . CAD (coronary artery disease)   . Myocardial infarction   . Anxiety   . Alcohol dependence with delirium   . 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 Diabetes mellitus without complication   . OSA (obstructive sleep apnea)   . Esophageal stricture   . Chronic pain   . GERD (gastroesophageal reflux disease)     CBC    Component Value Date/Time   WBC 9.3 11/19/2012 1445   RBC 4.28 11/19/2012 1445   HGB 13.7 11/19/2012 1445   HCT 39.7 11/19/2012 1445   PLT 215 11/19/2012 1445   MCV 92.8 11/19/2012 1445   LYMPHSABS 4.6* 11/19/2012 1445   MONOABS 1.1* 11/19/2012 1445   EOSABS 0.2 11/19/2012 1445   BASOSABS 0.0 11/19/2012 1445    CMP     Component Value Date/Time   NA 140 11/19/2012 1445   K 3.8 11/19/2012 1445   CL 100 11/19/2012 1445   CO2 24 11/19/2012 1445   GLUCOSE 108* 11/19/2012 1445   BUN 15 11/19/2012 1445   CREATININE 1.49* 11/19/2012 1445   CALCIUM 8.8 11/19/2012 1445   PROT 6.7 11/19/2012 1445   ALBUMIN 3.3* 11/19/2012 1445   AST 47* 11/19/2012 1445   ALT 39 11/19/2012 1445   ALKPHOS 66 11/19/2012 1445   BILITOT 0.3 11/19/2012 1445   GFRNONAA 46* 11/19/2012 1445   GFRAA 53* 11/19/2012 1445    Assessment and Plan  S/P CABG x 3 Post -op course complicated by alcoholic DT's with delerium and psychoses; admitted  for OT/PT  Alcohol dependence with delirium Pt on ativan, librium,thiamine, magnesium,  for same   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

## 2013-11-16 NOTE — Assessment & Plan Note (Signed)
Haldol 5mg  q6 prn

## 2013-11-16 NOTE — Assessment & Plan Note (Signed)
Pt on ativan, librium,thiamine, magnesium,  for same

## 2013-11-16 NOTE — Assessment & Plan Note (Signed)
Post -op course complicated by alcoholic DT's with delerium and psychoses; admitted for OT/PT

## 2013-11-16 NOTE — Assessment & Plan Note (Signed)
Pt on amiodarone, high does going to low in post op period

## 2013-11-16 NOTE — Assessment & Plan Note (Signed)
No aspiration per ST

## 2013-11-16 NOTE — Assessment & Plan Note (Signed)
H/o, not on CPAP, has asked to be sent for sleep study for w/u and tx

## 2013-11-16 NOTE — Assessment & Plan Note (Signed)
SSI ac

## 2014-07-07 IMAGING — CT CT HEAD W/O CM
2 of 4 series · 16 of 30 positions shown, 19 images · non-contrast
Comparison: 05/28/2009.

CLINICAL DATA: Mental status changes.

CT HEAD WITHOUT CONTRAST
TECHNIQUE: Contiguous axial images were obtained from the base of
the skull through the vertex without contrast.

[Series 2: head w/o · axial · non-contrast · 0.59mm/px · z∈[+1777,+1887]mm · 10 of 28 slices shown, 13 images]
[im 3/28  brain]
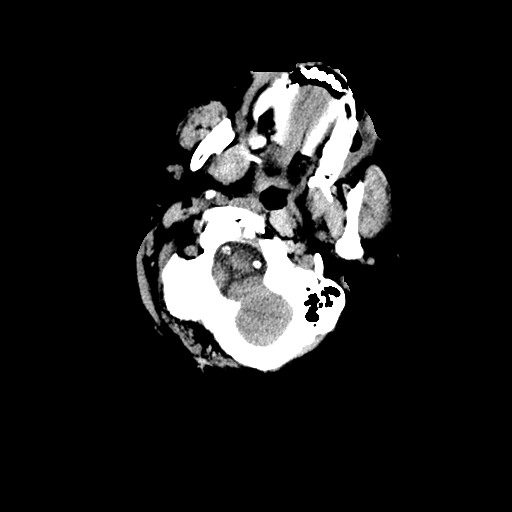
[im 3/28  bone]
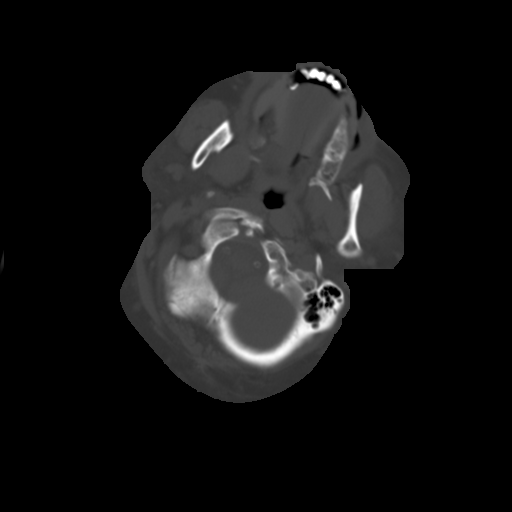
[im 5/28  brain]
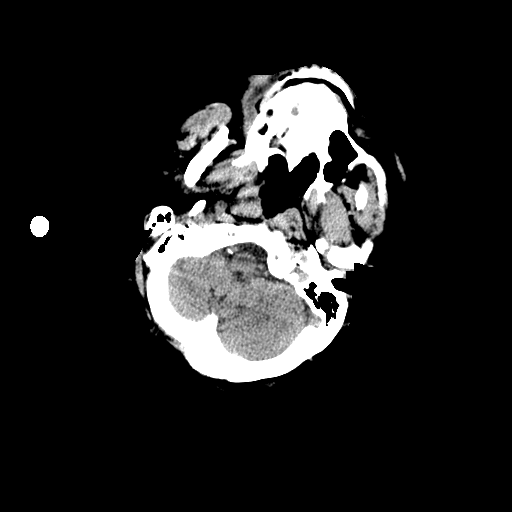
[im 8/28  brain]
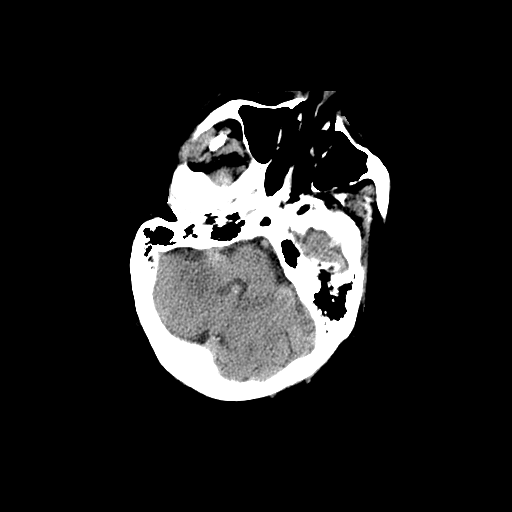
[im 10/28  brain]
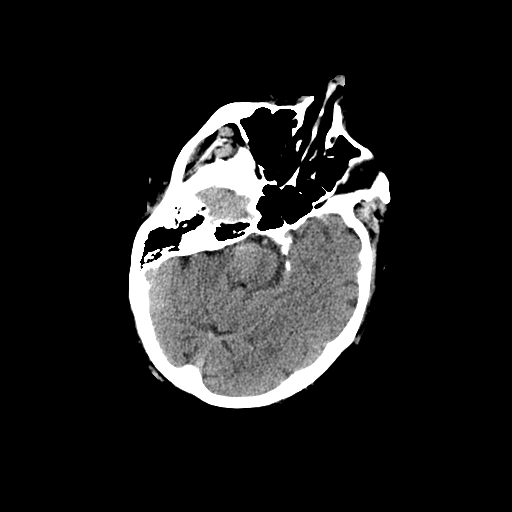
[im 13/28  brain]
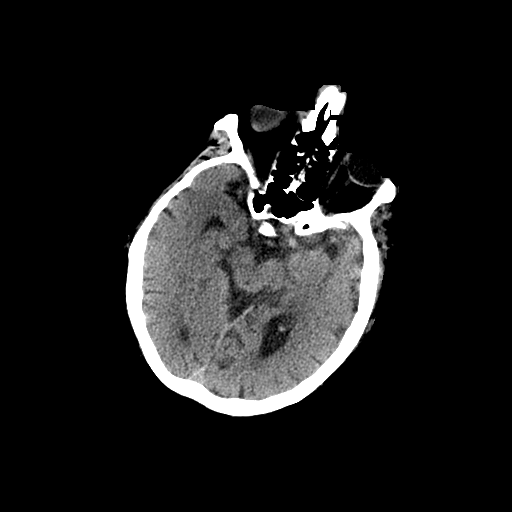
[im 13/28  bone]
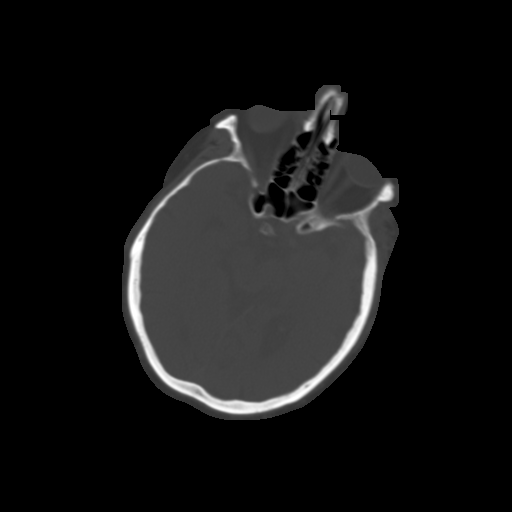
[im 15/28  brain]
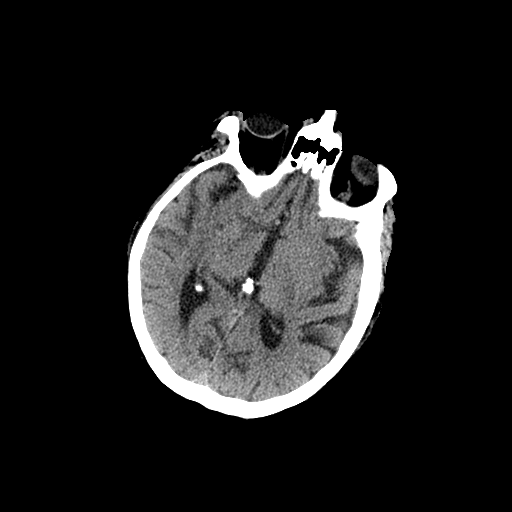
[im 18/28  brain]
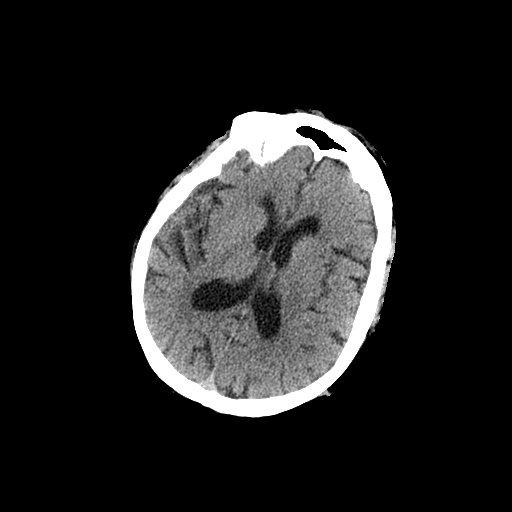
[im 20/28  brain]
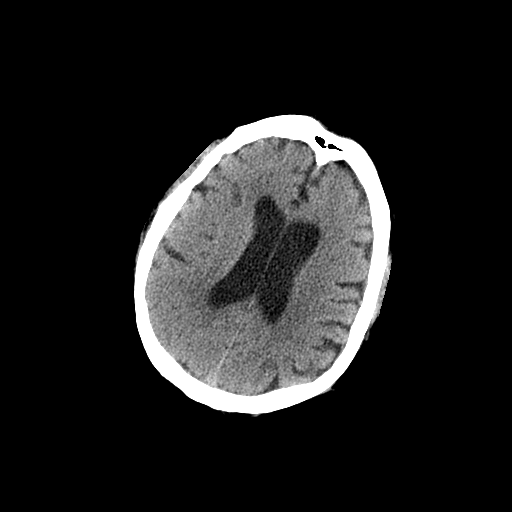
[im 23/28  brain]
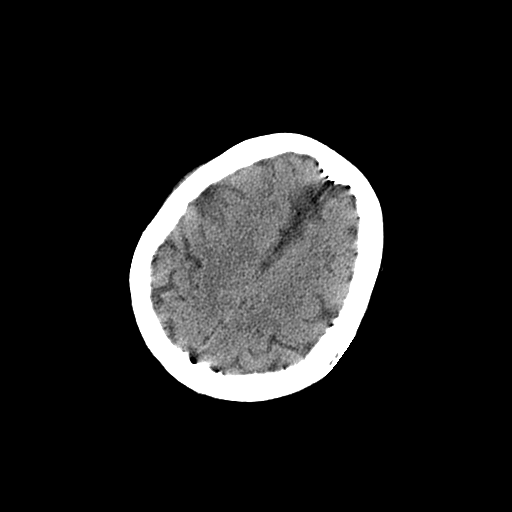
[im 23/28  bone]
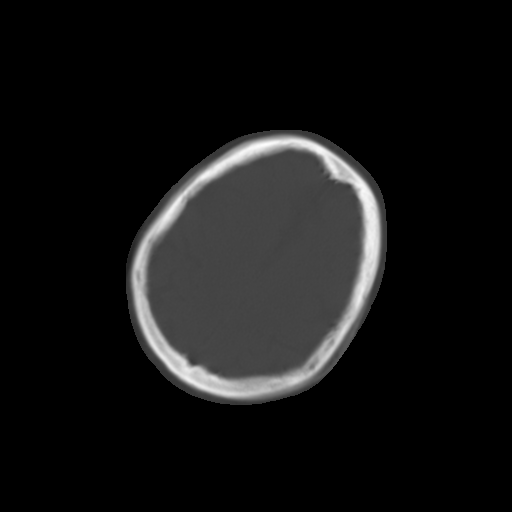
[im 25/28  brain]
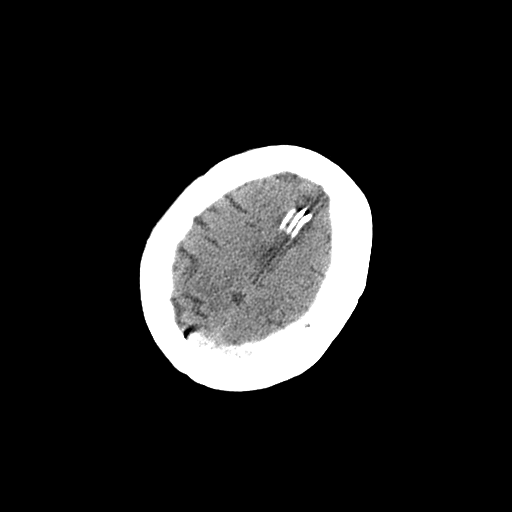

[Series 3: bone windows · axial · 0.59mm/px · z∈[+1777,+1852]mm · 6 of 28 slices shown]
[im 3/28  bone]
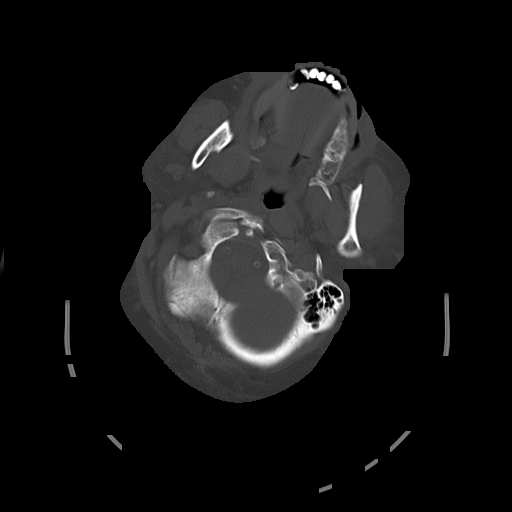
[im 5/28  bone]
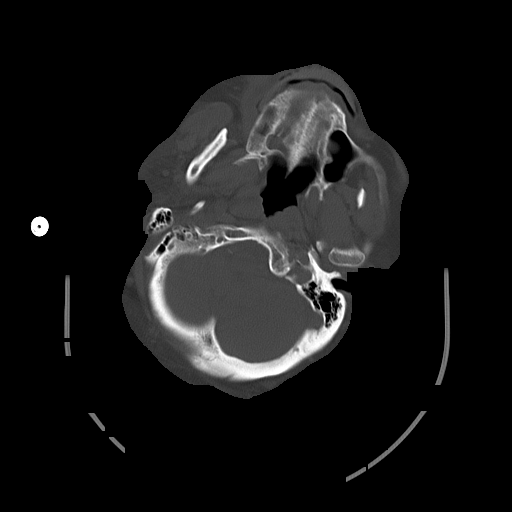
[im 10/28  bone]
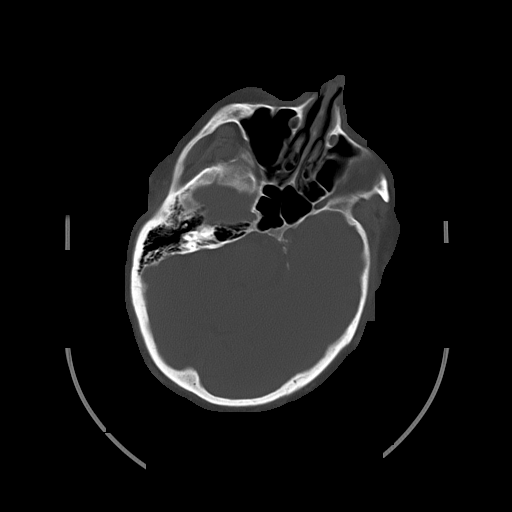
[im 13/28  bone]
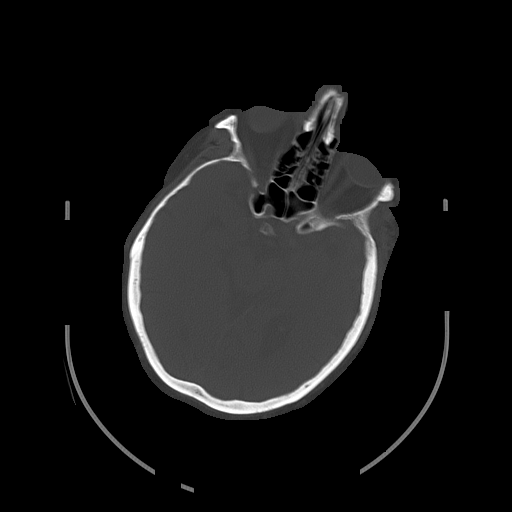
[im 15/28  bone]
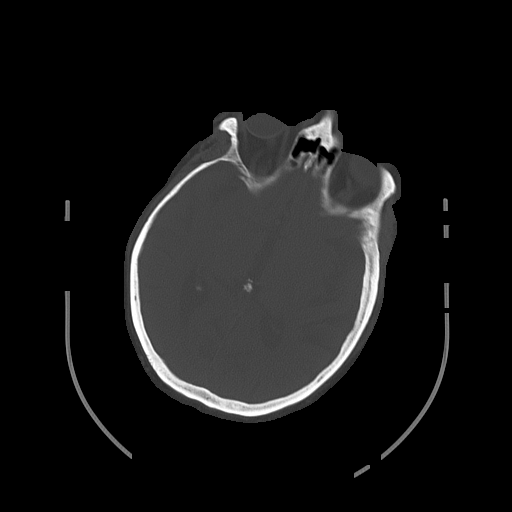
[im 18/28  bone]
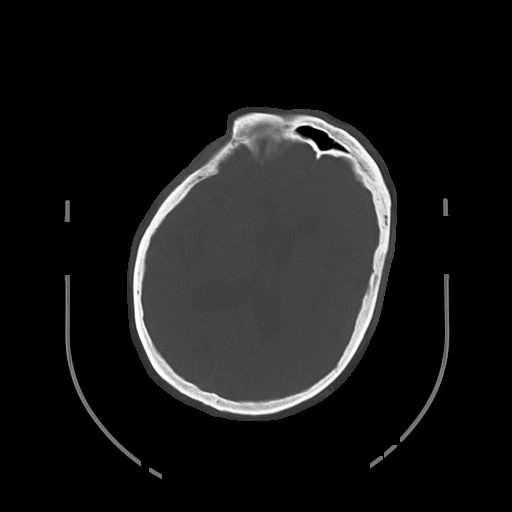

[16 of 30 positions shown; findings below may reference images not displayed]

FINDINGS: No skull fracture or intracranial hemorrhage.

Atrophy without hydrocephalus.

Vascular calcifications.

Hypodensity left aspect the pons may be related to the streak
artifact from motion.  This limits detection of infarct in this
region.  Overall, no CT evidence of large acute infarct.

Partial calcification and superior margin of the pituitary gland
may represent partial averaging rather than calcified mass and is
unchanged.  Overall, no intracranial mass lesion noted on this
exam.

Mastoid air cells, middle ear cavity and visualized sinuses are
clear.

Orbital structures unremarkable.
IMPRESSION: Motion degraded examination without evidence of intracranial
hemorrhage, skull fracture or obvious large acute infarct as noted
above.

Vascular calcifications.

Mild atrophy without hydrocephalus.

## 2014-11-30 ENCOUNTER — Institutional Professional Consult (permissible substitution): Payer: Non-veteran care | Admitting: Internal Medicine

## 2015-04-08 ENCOUNTER — Emergency Department (HOSPITAL_COMMUNITY): Payer: Medicare Other

## 2015-04-08 ENCOUNTER — Emergency Department (HOSPITAL_COMMUNITY)
Admission: EM | Admit: 2015-04-08 | Discharge: 2015-04-08 | Discharge status: Against Medical Advice | Payer: Medicare Other | Attending: Emergency Medicine | Admitting: Emergency Medicine

## 2015-04-08 ENCOUNTER — Encounter (HOSPITAL_COMMUNITY): Payer: Self-pay | Admitting: Emergency Medicine

## 2015-04-08 DIAGNOSIS — Z043 Encounter for examination and observation following other accident: Secondary | ICD-10-CM | POA: Insufficient documentation

## 2015-04-08 DIAGNOSIS — I10 Essential (primary) hypertension: Secondary | ICD-10-CM | POA: Diagnosis not present

## 2015-04-08 DIAGNOSIS — Y998 Other external cause status: Secondary | ICD-10-CM | POA: Insufficient documentation

## 2015-04-08 DIAGNOSIS — E119 Type 2 diabetes mellitus without complications: Secondary | ICD-10-CM | POA: Diagnosis not present

## 2015-04-08 DIAGNOSIS — Y9289 Other specified places as the place of occurrence of the external cause: Secondary | ICD-10-CM | POA: Diagnosis not present

## 2015-04-08 DIAGNOSIS — F419 Anxiety disorder, unspecified: Secondary | ICD-10-CM | POA: Insufficient documentation

## 2015-04-08 DIAGNOSIS — Z79899 Other long term (current) drug therapy: Secondary | ICD-10-CM | POA: Insufficient documentation

## 2015-04-08 DIAGNOSIS — Z8719 Personal history of other diseases of the digestive system: Secondary | ICD-10-CM | POA: Diagnosis not present

## 2015-04-08 DIAGNOSIS — Z951 Presence of aortocoronary bypass graft: Secondary | ICD-10-CM | POA: Diagnosis not present

## 2015-04-08 DIAGNOSIS — I2510 Atherosclerotic heart disease of native coronary artery without angina pectoris: Secondary | ICD-10-CM | POA: Diagnosis not present

## 2015-04-08 DIAGNOSIS — I252 Old myocardial infarction: Secondary | ICD-10-CM | POA: Diagnosis not present

## 2015-04-08 DIAGNOSIS — I4891 Unspecified atrial fibrillation: Secondary | ICD-10-CM | POA: Insufficient documentation

## 2015-04-08 DIAGNOSIS — W1839XA Other fall on same level, initial encounter: Secondary | ICD-10-CM | POA: Diagnosis not present

## 2015-04-08 DIAGNOSIS — Z794 Long term (current) use of insulin: Secondary | ICD-10-CM | POA: Diagnosis not present

## 2015-04-08 DIAGNOSIS — F10959 Alcohol use, unspecified with alcohol-induced psychotic disorder, unspecified: Secondary | ICD-10-CM | POA: Insufficient documentation

## 2015-04-08 DIAGNOSIS — Z7982 Long term (current) use of aspirin: Secondary | ICD-10-CM | POA: Insufficient documentation

## 2015-04-08 DIAGNOSIS — R4182 Altered mental status, unspecified: Secondary | ICD-10-CM | POA: Diagnosis present

## 2015-04-08 DIAGNOSIS — Y9389 Activity, other specified: Secondary | ICD-10-CM | POA: Diagnosis not present

## 2015-04-08 DIAGNOSIS — G8929 Other chronic pain: Secondary | ICD-10-CM | POA: Insufficient documentation

## 2015-04-08 LAB — CBG MONITORING, ED: GLUCOSE-CAPILLARY: 103 mg/dL — AB (ref 65–99)
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
## 2015-04-08 NOTE — ED Notes (Signed)
No lab draw, pt leaving AMA
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
## 2015-04-08 NOTE — ED Notes (Signed)
Went to interview pt. Pt would only say "that's not admittable."

## 2015-04-08 NOTE — ED Notes (Signed)
MD at bedside. 
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
## 2015-04-08 NOTE — ED Notes (Signed)
Pt left AMA. Pt ambulatory out of unit. MD aware.

## 2017-05-03 ENCOUNTER — Ambulatory Visit: Admit: 2017-05-03 | Payer: Non-veteran care | Admitting: Oculoplastics Ophthalmology

## 2017-05-03 SURGERY — REPAIR, BLEPHAROPTOSIS
Anesthesia: General | Laterality: Left

## 2020-08-21 DEATH — deceased
